# Patient Record
Sex: Male | Born: 1980
Health system: Southern US, Community
[De-identification: ages and names within clinical notes are randomized; demographics above are authoritative.]

## PROBLEM LIST (undated history)

## (undated) DIAGNOSIS — E559 Vitamin D deficiency, unspecified: Secondary | ICD-10-CM

## (undated) DIAGNOSIS — R17 Unspecified jaundice: Secondary | ICD-10-CM

## (undated) DIAGNOSIS — Z8619 Personal history of other infectious and parasitic diseases: Secondary | ICD-10-CM

## (undated) DIAGNOSIS — I7300 Raynaud's syndrome without gangrene: Secondary | ICD-10-CM

## (undated) HISTORY — PX: WISDOM TOOTH EXTRACTION: SHX21

## (undated) HISTORY — DX: Vitamin D deficiency, unspecified: E55.9

## (undated) HISTORY — PX: CYST EXCISION: SHX5701

## (undated) HISTORY — DX: Unspecified jaundice: R17

## (undated) HISTORY — DX: Personal history of other infectious and parasitic diseases: Z86.19

---

## 2018-09-01 ENCOUNTER — Ambulatory Visit (INDEPENDENT_AMBULATORY_CARE_PROVIDER_SITE_OTHER): Payer: 59 | Admitting: Family Medicine

## 2018-09-01 ENCOUNTER — Encounter: Payer: Self-pay | Admitting: Family Medicine

## 2018-09-01 ENCOUNTER — Other Ambulatory Visit: Payer: Self-pay

## 2018-09-01 VITALS — BP 100/70 | HR 78 | Temp 98.2°F | Ht 73.0 in | Wt 199.6 lb

## 2018-09-01 DIAGNOSIS — Z0000 Encounter for general adult medical examination without abnormal findings: Secondary | ICD-10-CM | POA: Diagnosis not present

## 2018-09-01 DIAGNOSIS — Z114 Encounter for screening for human immunodeficiency virus [HIV]: Secondary | ICD-10-CM

## 2018-09-01 DIAGNOSIS — E538 Deficiency of other specified B group vitamins: Secondary | ICD-10-CM

## 2018-09-01 DIAGNOSIS — E559 Vitamin D deficiency, unspecified: Secondary | ICD-10-CM | POA: Insufficient documentation

## 2018-09-01 DIAGNOSIS — R768 Other specified abnormal immunological findings in serum: Secondary | ICD-10-CM | POA: Insufficient documentation

## 2018-09-01 DIAGNOSIS — I7300 Raynaud's syndrome without gangrene: Secondary | ICD-10-CM | POA: Insufficient documentation

## 2018-09-01 NOTE — Progress Notes (Deleted)
Patient: Alexander Lynch MRN: [id number] DOB: 04-Jun-1980 PCP: No primary care provider on file.     I connected with Alexander Lynch on 09/01/18 at @CHLAPPTIME @ by a video enabled telemedicine application and verified that I am speaking with the correct person using two identifiers.  Location patient: Home Location provider: [facility], Office Persons participating in this virtual visit: ***  I discussed the limitations of evaluation and management by telemedicine and the availability of in person appointments. The patient expressed understanding and agreed to proceed.   Subjective:  Chief Complaint  Patient presents with  . Establish Care    HPI: The patient is a 38 y.o. male who presents today for ***  Review of Systems  Allergies Patient has no allergies on file.  Past Medical History Patient  has no past medical history on file.  Surgical History Patient  has no past surgical history on file.  Family History Pateint's family history is not on file.  Social History Patient      Objective: There were no vitals filed for this visit.  There is no height or weight on file to calculate BMI.  Physical Exam     Assessment/plan:      No follow-ups on file.  Records requested if needed. Time spent with patient: *** minutes, of which >50% was spent in obtaining information about his symptoms, reviweing his previous labs, evaluations, and treatments, counseling him about his conditions (please see discussed topics above), and developing a plan to further investigate it; he had a number of questions which I addressed.    Orma Flaming, MD [place]  09/01/2018

## 2018-09-01 NOTE — Progress Notes (Addendum)
Patient: Alexander Lynch MRN: [id number] DOB: [birth year] PCP: Orma Flaming, MD    I connected with  Vincent Peyer Haidar on 09/01/18 by a video enabled telemedicine application and verified that I am speaking with the correct person using two identifiers.   I discussed the limitations of evaluation and management by telemedicine. The patient expressed understanding and agreed to proceed.   Subjective:  Chief Complaint  Patient presents with  . Establish Care    HPI: The patient is a 38 y.o. male who presents today for annual exam. He denies any changes to past medical history. There have been no recent hospitalizations. They are following a well balanced diet, but does not exercise.  Weight has been stable. No complaints today.   He has a past medical hx consistent with raynauds and +ANA for RNP ab. Tested in 2017. No issues. +FH of Raynauds disease in mom and sister. He will use CBD oil only in very cold spells.   Hx of vitamin D deficiency from previous labs as well as border line low B12. He does take 2000IU/day of vitamin D and 1039mcg of B12 daily.   No first degree relative with colon cancer or prostate cancer. Maternal grandfather died from cancer but unknown what type. Hx of HTN, fatty liver, type 2 diabetes, obesity in his mother.   There is no immunization history on file for this patient.  Tdap: 2018 hiv screen: today  Flu: done this year   Review of Systems  Constitutional: Negative for chills, fatigue and fever.  HENT: Negative for dental problem, ear pain, hearing loss and trouble swallowing.   Eyes: Negative for visual disturbance.  Respiratory: Negative for cough, chest tightness and shortness of breath.   Cardiovascular: Negative for chest pain, palpitations and leg swelling.  Gastrointestinal: Negative for abdominal pain, blood in stool, diarrhea and nausea.  Endocrine: Negative for cold intolerance, polydipsia, polyphagia and polyuria.  Genitourinary: Negative for  dysuria and hematuria.  Musculoskeletal: Negative for arthralgias.  Skin: Negative for rash.  Neurological: Negative for dizziness and headaches.  Psychiatric/Behavioral: Negative for dysphoric mood and sleep disturbance. The patient is not nervous/anxious.     Allergies Patient has No Known Allergies.  Past Medical History Patient  has a past medical history of History of chicken pox.  Surgical History Patient  has no past surgical history on file.  Family History Pateint's family history includes Cancer in his maternal aunt, maternal grandfather, and maternal grandmother; Dementia in his maternal grandmother; Diabetes in his mother; Hearing loss in his father and mother; Hypertension in his mother; Obesity in his mother.  Social History Patient  reports that he has never smoked. He has never used smokeless tobacco. He reports current alcohol use. He reports that he does not use drugs.    Objective: Vitals:   09/01/18 0841  BP: 100/70  Pulse: 78  Temp: 98.2 F (36.8 C)  TempSrc: Oral  SpO2: 98%  Weight: 199 lb 9.6 oz (90.5 kg)  Height: 6\' 1"  (1.854 m)    Body mass index is 26.33 kg/m.  Physical Exam Vitals signs reviewed.  Constitutional:      Appearance: Normal appearance.  HENT:     Head: Normocephalic and atraumatic.  Pulmonary:     Effort: Pulmonary effort is normal.  Neurological:     General: No focal deficit present.     Mental Status: He is alert and oriented to person, place, and time.  Psychiatric:        Mood  and Affect: Mood normal.        Behavior: Behavior normal.        Assessment/plan: 1. Annual physical exam utd on HM. Requesting records for tdap, but other records received and reviewed before appointment. Recommended exercise into lifestyle, otherwise doing great. Future ordered labs and he can come in at his own time.  Patient counseling [x]    Nutrition: Stressed importance of moderation in sodium/caffeine intake, saturated fat and  cholesterol, caloric balance, sufficient intake of fresh fruits, vegetables, fiber, calcium, iron, and 1 mg of folate supplement per day (for females capable of pregnancy).  [x]    Stressed the importance of regular exercise.   []    Substance Abuse: Discussed cessation/primary prevention of tobacco, alcohol, or other drug use; driving or other dangerous activities under the influence; availability of treatment for abuse.   [x]    Injury prevention: Discussed safety belts, safety helmets, smoke detector, smoking near bedding or upholstery.   [x]    Sexuality: Discussed sexually transmitted diseases, partner selection, use of condoms, avoidance of unintended pregnancy  and contraceptive alternatives.  [x]    Dental health: Discussed importance of regular tooth brushing, flossing, and dental visits.  [x]    Health maintenance and immunizations reviewed. Please refer to Health maintenance section.   - CBC with Differential/Platelet; Future - Comprehensive metabolic panel; Future - Lipid panel; Future - TSH; Future  2. Vitamin D deficiency  - VITAMIN D 25 Hydroxy (Vit-D Deficiency, Fractures); Future  3. Encounter for screening for HIV  - HIV Antibody (routine testing w rflx); Future  4. B12 deficiency  - Vitamin B12; Future   Return in about 1 year (around 09/01/2019).   Orma Flaming, MD [place]  09/01/2018

## 2018-10-05 ENCOUNTER — Other Ambulatory Visit: Payer: Self-pay

## 2018-10-05 ENCOUNTER — Other Ambulatory Visit (INDEPENDENT_AMBULATORY_CARE_PROVIDER_SITE_OTHER): Payer: 59

## 2018-10-05 DIAGNOSIS — E538 Deficiency of other specified B group vitamins: Secondary | ICD-10-CM | POA: Diagnosis not present

## 2018-10-05 DIAGNOSIS — Z0000 Encounter for general adult medical examination without abnormal findings: Secondary | ICD-10-CM | POA: Diagnosis not present

## 2018-10-05 DIAGNOSIS — E559 Vitamin D deficiency, unspecified: Secondary | ICD-10-CM | POA: Diagnosis not present

## 2018-10-05 DIAGNOSIS — Z114 Encounter for screening for human immunodeficiency virus [HIV]: Secondary | ICD-10-CM | POA: Diagnosis not present

## 2018-10-05 LAB — LIPID PANEL
Cholesterol: 161 mg/dL (ref 0–200)
HDL: 34 mg/dL — ABNORMAL LOW (ref >39.00)
NonHDL: 126.67
Total CHOL/HDL Ratio: 5
Triglycerides: 250 mg/dL — ABNORMAL HIGH (ref 0.0–149.0)
VLDL: 50 mg/dL — ABNORMAL HIGH (ref 0.0–40.0)

## 2018-10-05 LAB — COMPREHENSIVE METABOLIC PANEL
ALT: 31 U/L (ref 0–53)
AST: 18 U/L (ref 0–37)
Albumin: 4.5 g/dL (ref 3.5–5.2)
Alkaline Phosphatase: 54 U/L (ref 39–117)
BUN: 15 mg/dL (ref 6–23)
CO2: 32 mEq/L (ref 19–32)
Calcium: 9.3 mg/dL (ref 8.4–10.5)
Chloride: 100 mEq/L (ref 96–112)
Creatinine, Ser: 0.8 mg/dL (ref 0.40–1.50)
GFR: 106.93 mL/min (ref >60.00)
Glucose, Bld: 84 mg/dL (ref 70–99)
Potassium: 4.7 mEq/L (ref 3.5–5.1)
Sodium: 139 mEq/L (ref 135–145)
Total Bilirubin: 0.5 mg/dL (ref 0.2–1.2)
Total Protein: 7.4 g/dL (ref 6.0–8.3)

## 2018-10-05 LAB — TSH: TSH: 1.66 u[IU]/mL (ref 0.35–4.50)

## 2018-10-05 LAB — CBC WITH DIFFERENTIAL/PLATELET
Basophils Absolute: 0 10*3/uL (ref 0.0–0.1)
Basophils Relative: 0.6 % (ref 0.0–3.0)
Eosinophils Absolute: 0.2 10*3/uL (ref 0.0–0.7)
Eosinophils Relative: 4.6 % (ref 0.0–5.0)
HCT: 43.6 % (ref 39.0–52.0)
Hemoglobin: 15 g/dL (ref 13.0–17.0)
Lymphocytes Relative: 44.6 % (ref 12.0–46.0)
Lymphs Abs: 2.1 10*3/uL (ref 0.7–4.0)
MCHC: 34.4 g/dL (ref 30.0–36.0)
MCV: 89.3 fl (ref 78.0–100.0)
Monocytes Absolute: 0.4 10*3/uL (ref 0.1–1.0)
Monocytes Relative: 8.2 % (ref 3.0–12.0)
Neutro Abs: 1.9 10*3/uL (ref 1.4–7.7)
Neutrophils Relative %: 42 % — ABNORMAL LOW (ref 43.0–77.0)
Platelets: 139 10*3/uL — ABNORMAL LOW (ref 150.0–400.0)
RBC: 4.88 Mil/uL (ref 4.22–5.81)
RDW: 12.8 % (ref 11.5–15.5)
WBC: 4.6 10*3/uL (ref 4.0–10.5)

## 2018-10-05 LAB — VITAMIN B12: Vitamin B-12: 160 pg/mL — ABNORMAL LOW (ref 211–911)

## 2018-10-05 LAB — VITAMIN D 25 HYDROXY (VIT D DEFICIENCY, FRACTURES): VITD: 20.81 ng/mL — ABNORMAL LOW (ref 30.00–100.00)

## 2018-10-05 LAB — LDL CHOLESTEROL, DIRECT: Direct LDL: 90 mg/dL

## 2018-10-06 LAB — HIV ANTIBODY (ROUTINE TESTING W REFLEX): HIV 1&2 Ab, 4th Generation: NONREACTIVE

## 2018-10-07 ENCOUNTER — Other Ambulatory Visit: Payer: Self-pay | Admitting: Family Medicine

## 2018-10-07 MED ORDER — VITAMIN D (ERGOCALCIFEROL) 1.25 MG (50000 UNIT) PO CAPS: ORAL_CAPSULE | ORAL | 0 refills | Status: DC

## 2018-10-29 ENCOUNTER — Other Ambulatory Visit: Payer: Self-pay | Admitting: Family Medicine

## 2018-10-29 MED ORDER — VITAMIN D (ERGOCALCIFEROL) 1.25 MG (50000 UNIT) PO CAPS: ORAL_CAPSULE | ORAL | 0 refills | Status: DC

## 2018-10-29 MED FILL — VIT D2 1.25 MG (50,000 UNIT: 1.25 MG | 56 days supply | Qty: 8 | Fill #0

## 2019-07-18 ENCOUNTER — Other Ambulatory Visit: Payer: Self-pay

## 2019-07-18 ENCOUNTER — Ambulatory Visit
Admission: EM | Admit: 2019-07-18 | Discharge: 2019-07-18 | Disposition: A | Discharge status: Home / Self Care | Payer: No Typology Code available for payment source | Attending: Emergency Medicine | Admitting: Emergency Medicine

## 2019-07-18 ENCOUNTER — Encounter: Payer: Self-pay | Admitting: Emergency Medicine

## 2019-07-18 ENCOUNTER — Ambulatory Visit (INDEPENDENT_AMBULATORY_CARE_PROVIDER_SITE_OTHER): Payer: No Typology Code available for payment source

## 2019-07-18 DIAGNOSIS — M545 Low back pain, unspecified: Secondary | ICD-10-CM

## 2019-07-18 HISTORY — DX: Raynaud's syndrome without gangrene: I73.00

## 2019-07-18 MED ORDER — KETOROLAC TROMETHAMINE 30 MG/ML IJ SOLN
30.0000 mg | Freq: Once | INTRAMUSCULAR | Status: AC
  Administered 2019-07-18: 30 mg via INTRAMUSCULAR

## 2019-07-18 NOTE — ED Notes (Signed)
Patient able to ambulate independently  

## 2019-07-18 NOTE — Discharge Instructions (Addendum)
Recommend RICE: rest, ice, compression, elevation as needed for pain.   Heat therapy (hot compress, warm wash red, hot showers, etc.) can help relax muscles and soothe muscle aches. Cold therapy (ice packs) can be used to help swelling both after injury and after prolonged use of areas of chronic pain/aches.  For pain: recommend 350 mg-1000 mg of Tylenol (acetaminophen) and/or 200 mg - 800 mg of Advil (ibuprofen, Motrin) every 8 hours as needed.  May alternate between the two throughout the day as they are generally safe to take together.  DO NOT exceed more than 3000 mg of Tylenol or 3200 mg of ibuprofen in a 24 hour period as this could damage your stomach, kidneys, liver, or increase your bleeding risk.  Please keep your follow-up appointment with Ortho tomorrow at 230-please go to the ER in the interim if you develop numbness in your bathing suit area or legs, weakness, difficulty holding your bowel or bladder, fever.

## 2019-07-18 NOTE — ED Triage Notes (Signed)
Pt presents to [facility] for assessment of lower back pain after taking his first few steps for an exercise run and began to have right lower back pain.

## 2019-07-18 NOTE — ED Provider Notes (Signed)
[facility]    CSN: [id number] Arrival date & time: 07/18/19  1249      History   Chief Complaint Chief Complaint  Patient presents with  . Back Pain    HPI Alexander Lynch is a 39 y.o. male  presenting for acute onset of low back pain (R>L) since earlier this morning.  Patient states that he has a history of a "bulging disc in his low back" for which he typically does exercises for.  States that he went to go for a run this morning, and a few steps and when pushing off with his left foot he felt a sharp pain in the right side of his back caused him to fall.  Patient states that his wife had to help him get home, and he tried his exercises without relief.  Patient denies saddle area anesthesia, change in bowel or bladder habit, lower extremity weakness/numbness.   Past Medical History:  Diagnosis Date  . History of chicken pox   . Raynaud's disease     Patient Active Problem List   Diagnosis Date Noted  . Raynauds phenomenon 09/01/2018  . ANA positive 09/01/2018  . Vitamin D deficiency 09/01/2018    History reviewed. No pertinent surgical history.     Home Medications    Prior to Admission medications   Medication Sig Start Date End Date Taking? Authorizing Provider  Vitamin D, Ergocalciferol, (DRISDOL) 1.25 MG (50000 UT) CAPS capsule One capsule by mouth once a week for 8 weeks. Then take 1000IU/day 10/29/18   Orma Flaming, MD    Family History Family History  Problem Relation Age of Onset  . Diabetes Mother   . Hypertension Mother   . Hearing loss Mother   . Obesity Mother   . Hearing loss Father   . Cancer Maternal Aunt   . Cancer Maternal Grandmother   . Dementia Maternal Grandmother   . Cancer Maternal Grandfather     Social History Social History   Tobacco Use  . Smoking status: Never Smoker  . Smokeless tobacco: Never Used  Substance Use Topics  . Alcohol use: Yes    Comment: rarely  . Drug use: Never     Allergies   Patient  has no known allergies.   Review of Systems As per HPI   Physical Exam Triage Vital Signs ED Triage Vitals [07/18/19 1300]  Enc Vitals Group     BP (!) 141/97     Pulse Rate 91     Resp 18     Temp 97.6 F (36.4 C)     Temp Source Temporal     SpO2 98 %     Weight      Height      Head Circumference      Peak Flow      Pain Score 6     Pain Loc      Pain Edu?      Excl. in [place]?    No data found.  Updated Vital Signs BP (!) 141/97 (BP Location: Right Arm)   Pulse 91   Temp 97.6 F (36.4 C) (Temporal)   Resp 18   SpO2 98%   Visual Acuity Right Eye Distance:   Left Eye Distance:   Bilateral Distance:    Right Eye Near:   Left Eye Near:    Bilateral Near:     Physical Exam Constitutional:      General: He is not in acute distress. HENT:  Head: Normocephalic and atraumatic.  Eyes:     General: No scleral icterus.    Pupils: Pupils are equal, round, and reactive to light.  Cardiovascular:     Rate and Rhythm: Normal rate.  Pulmonary:     Effort: Pulmonary effort is normal. No respiratory distress.     Breath sounds: No wheezing.  Musculoskeletal:     Comments: Patient leaning to left side.  Tender over mid and right lumbar spine sparing PSIS.  No obvious bony deformity, scoliosis.  Overall exam limited second to patient's cooperation due to pain  Skin:    Coloration: Skin is not jaundiced or pale.  Neurological:     Mental Status: He is alert and oriented to person, place, and time.     Gait: Gait abnormal.      UC Treatments / Results  Labs (all labs ordered are listed, but only abnormal results are displayed) Labs Reviewed - No data to display  EKG   Radiology DG Lumbar Spine Complete  Result Date: 07/18/2019 CLINICAL DATA:  Pain, injury, history of disc bulge EXAM: LUMBAR SPINE - COMPLETE 4+ VIEW COMPARISON:  None. FINDINGS: No fracture or dislocation of the lumbar spine. There is minimal multilevel disc space height loss and  osteophytosis throughout. Mild facet degenerative change of the lower lumbar levels. Nonobstructive pattern of overlying bowel gas. IMPRESSION: No fracture or dislocation of the lumbar spine. Minimal multilevel disc space height loss and osteophytosis throughout. Mild facet degenerative change of the lower lumbar levels. Lumbar disc and neural foraminal pathology may be further evaluated by MRI if indicated by localizing signs and symptoms. Electronically Signed   By: Eddie Candle M.D.   On: 07/18/2019 13:38    Procedures Procedures (including critical care time)  Medications Ordered in UC Medications  ketorolac (TORADOL) 30 MG/ML injection 30 mg (30 mg Intramuscular Given 07/18/19 1416)    Initial Impression / Assessment and Plan / UC Course  I have reviewed the triage vital signs and the nursing notes.  Pertinent labs & imaging results that were available during my care of the patient were reviewed by me and considered in my medical decision making (see chart for details).     Patient without alarm symptoms such as saddle anesthesia, lower extremity weakness, numbness, change in bowel or bladder habit, fever.  Lumbar x-ray obtained in office, reviewed by me and radiology without previous to compare: No fracture or dislocation of lumbar spine.  There are minimal multilevel disc space height loss and osteophytosis.  There is mild facet degenerative change of lower lumbar levels.  Could evaluate further via MRI.  Very findings with patient verbalized understanding.  This patient coordinated care: Has Ortho appointment tomorrow 230-relayed info to patient verbalized understanding intends to keep this.  Given Toradol shot for pain and inflammation, will try additional supportive management as outlined below at home.  Strict ER return precautions for discussed, patient verbalized understanding and is agreeable to plan. Final Clinical Impressions(s) / UC Diagnoses   Final diagnoses:  Acute  right-sided low back pain without sciatica     Discharge Instructions     Recommend RICE: rest, ice, compression, elevation as needed for pain.   Heat therapy (hot compress, warm wash red, hot showers, etc.) can help relax muscles and soothe muscle aches. Cold therapy (ice packs) can be used to help swelling both after injury and after prolonged use of areas of chronic pain/aches.  For pain: recommend 350 mg-1000 mg of Tylenol (acetaminophen) and/or 200  mg - 800 mg of Advil (ibuprofen, Motrin) every 8 hours as needed.  May alternate between the two throughout the day as they are generally safe to take together.  DO NOT exceed more than 3000 mg of Tylenol or 3200 mg of ibuprofen in a 24 hour period as this could damage your stomach, kidneys, liver, or increase your bleeding risk.  Please keep your follow-up appointment with Ortho tomorrow at 230-please go to the ER in the interim if you develop numbness in your bathing suit area or legs, weakness, difficulty holding your bowel or bladder, fever.    ED Prescriptions    None     PDMP not reviewed this encounter.   [place], [place], [place] 07/18/19 1435

## 2021-07-31 ENCOUNTER — Telehealth: Payer: Self-pay | Admitting: Gastroenterology

## 2021-07-31 IMAGING — DX DG LUMBAR SPINE COMPLETE 4+V
5 series · 5 of 5 positions shown · non-contrast
Comparison: None.

CLINICAL DATA: Pain, injury, history of disc bulge

EXAM:
LUMBAR SPINE - COMPLETE 4+ VIEW

[lumbar spine ap (1 of 3)]
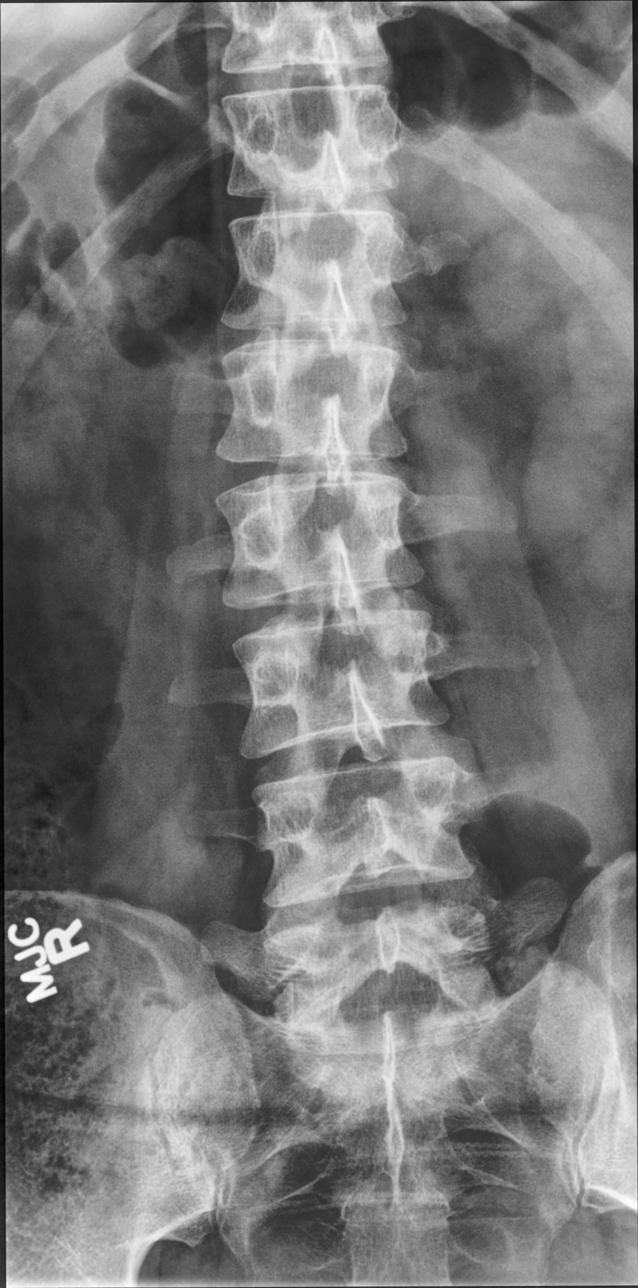

[lumbar spine ap (2 of 3)]
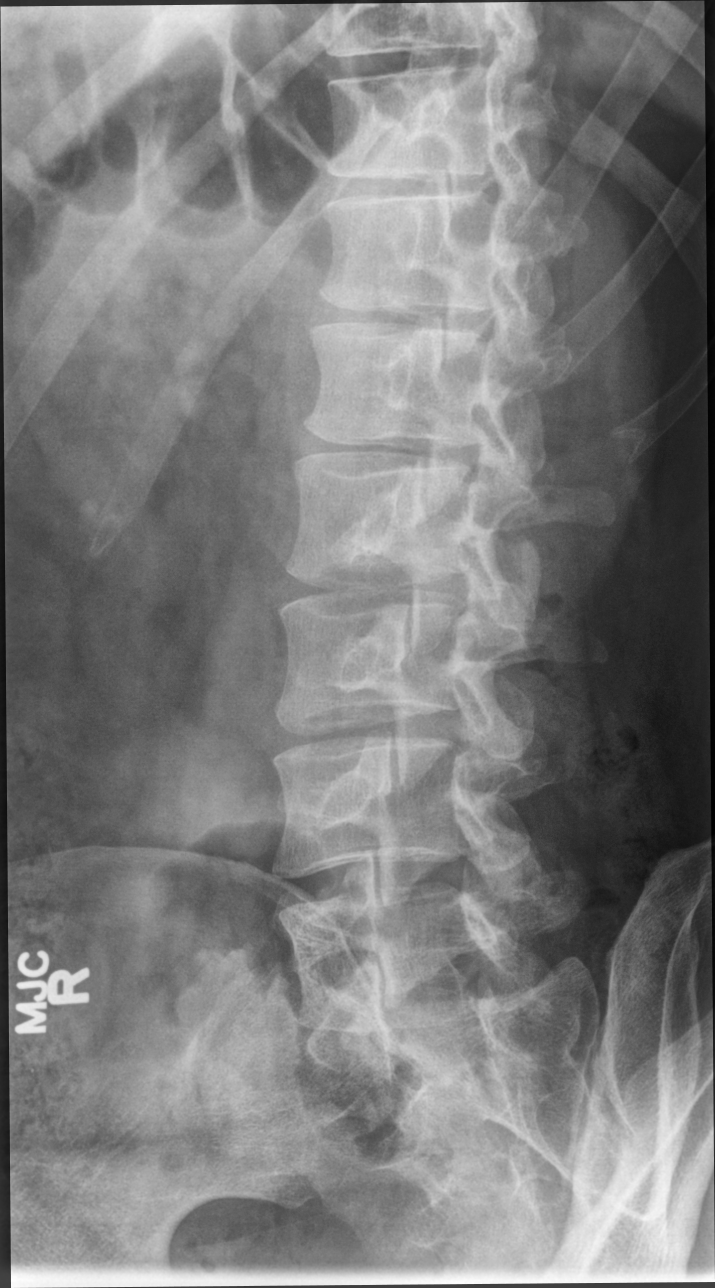

[lumbar spine ap (3 of 3)]
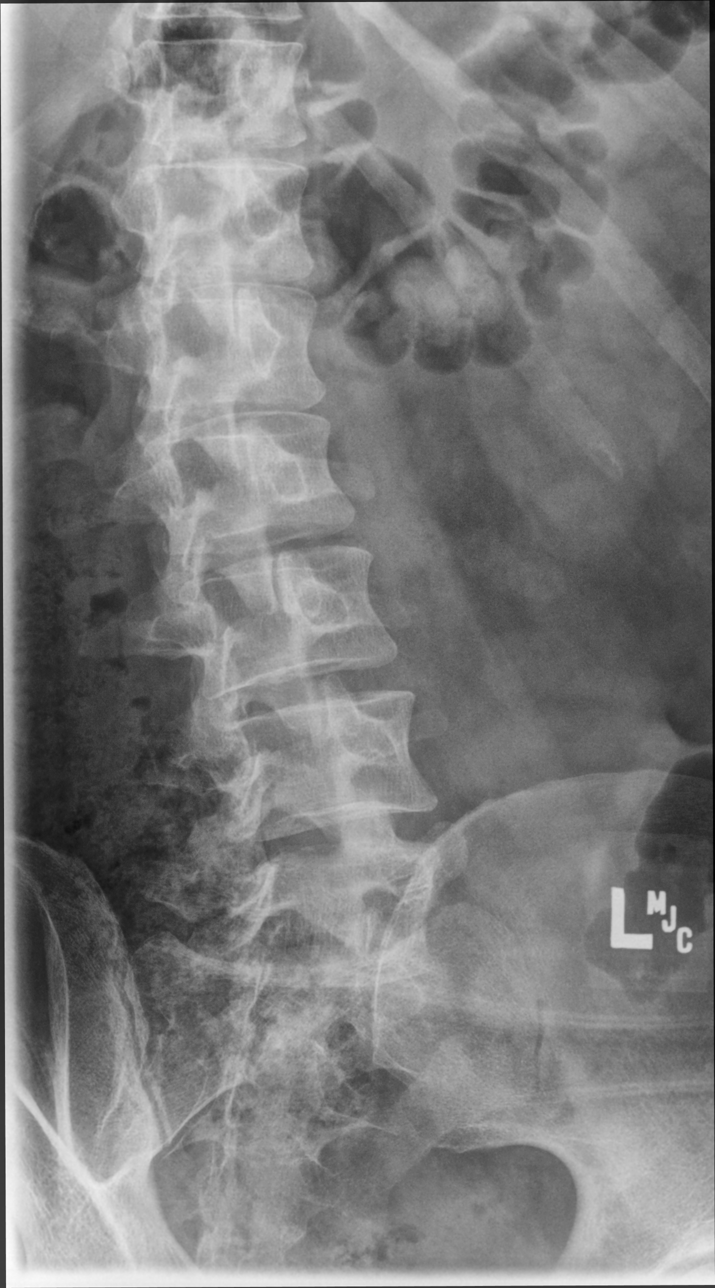

[lumbar spine lat (1 of 2)]
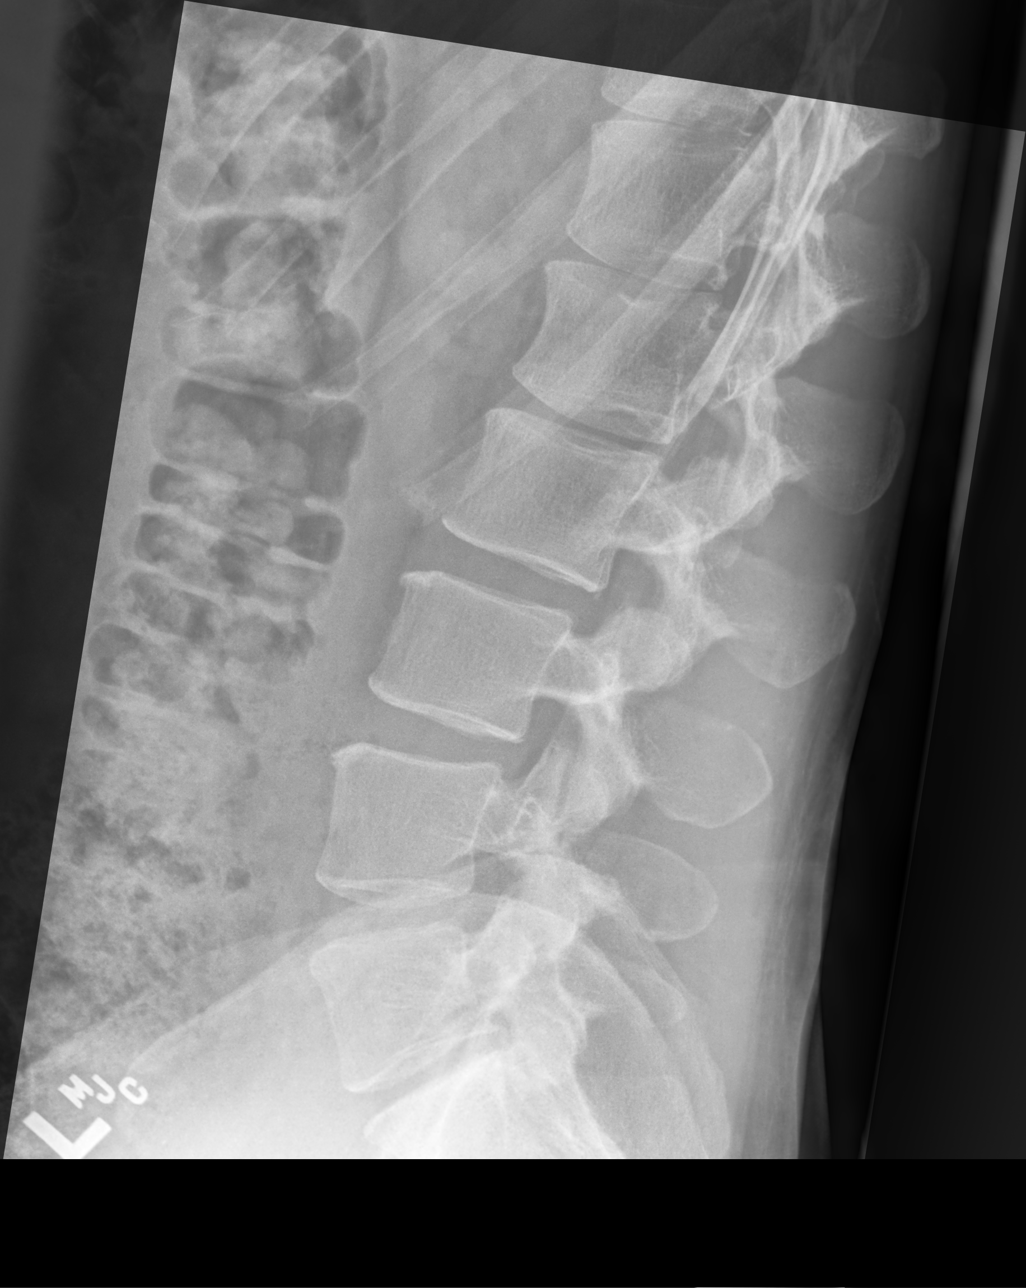

[lumbar spine lat (2 of 2)]
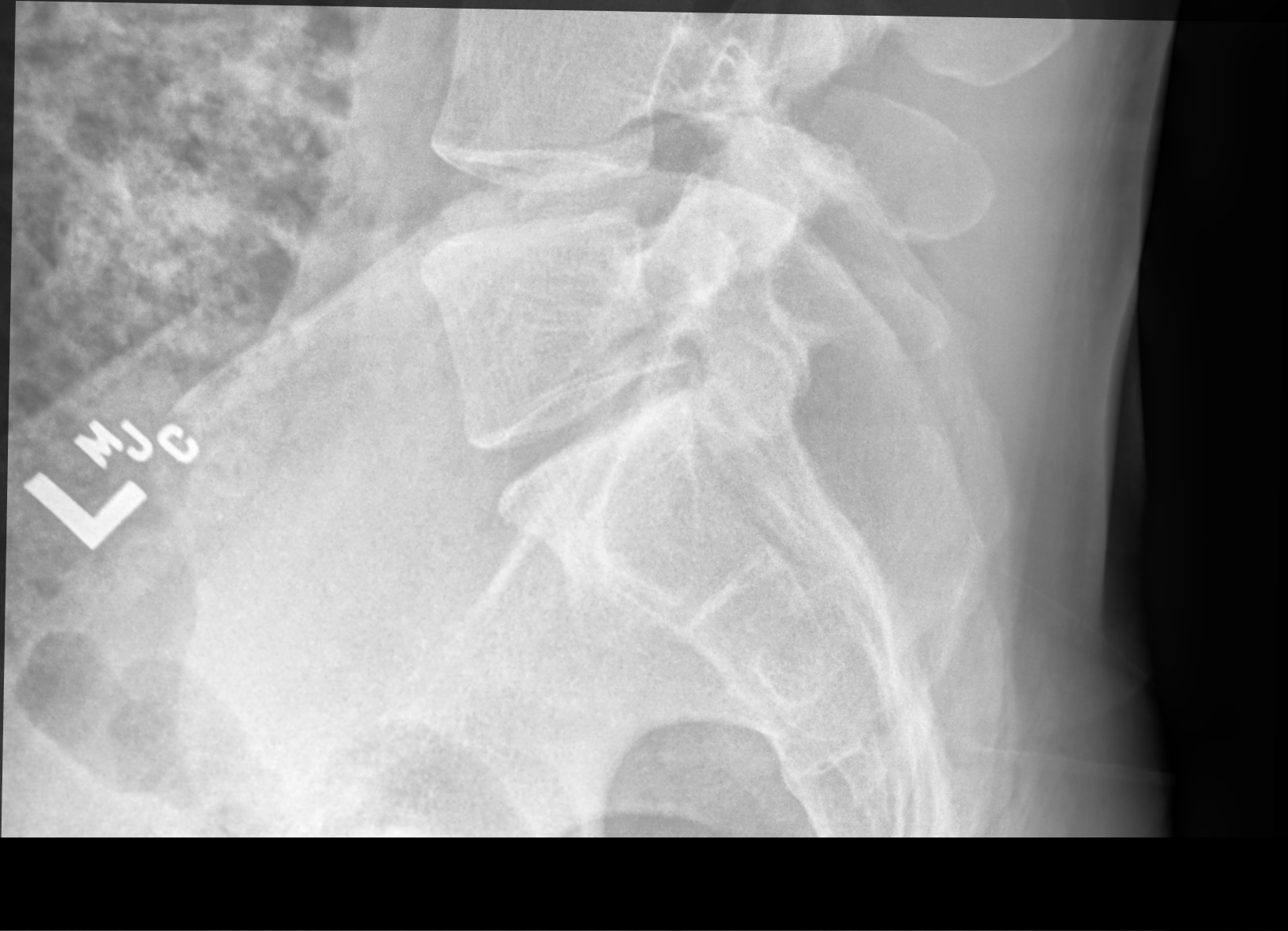

[5 of 5 positions shown; findings below may reference images not displayed]

FINDINGS: No fracture or dislocation of the lumbar spine. There is minimal
multilevel disc space height loss and osteophytosis throughout. Mild
facet degenerative change of the lower lumbar levels. Nonobstructive
pattern of overlying bowel gas.
IMPRESSION: No fracture or dislocation of the lumbar spine. Minimal multilevel
disc space height loss and osteophytosis throughout. Mild facet
degenerative change of the lower lumbar levels. Lumbar disc and
neural foraminal pathology may be further evaluated by MRI if
indicated by localizing signs and symptoms.

## 2021-08-01 ENCOUNTER — Encounter: Payer: Self-pay | Admitting: Gastroenterology

## 2021-08-01 NOTE — Telephone Encounter (Signed)
error 

## 2021-08-20 ENCOUNTER — Encounter: Payer: Self-pay | Admitting: Gastroenterology

## 2021-08-20 ENCOUNTER — Ambulatory Visit: Payer: 59 | Admitting: Gastroenterology

## 2021-08-20 ENCOUNTER — Other Ambulatory Visit (INDEPENDENT_AMBULATORY_CARE_PROVIDER_SITE_OTHER): Payer: 59

## 2021-08-20 VITALS — BP 112/74 | HR 74 | Ht 73.0 in | Wt 201.0 lb

## 2021-08-20 DIAGNOSIS — R109 Unspecified abdominal pain: Secondary | ICD-10-CM

## 2021-08-20 DIAGNOSIS — R17 Unspecified jaundice: Secondary | ICD-10-CM

## 2021-08-20 DIAGNOSIS — R197 Diarrhea, unspecified: Secondary | ICD-10-CM

## 2021-08-20 LAB — TSH: TSH: 1.38 u[IU]/mL (ref 0.35–5.50)

## 2021-08-20 LAB — SEDIMENTATION RATE: Sed Rate: 13 mm/hr (ref 0–15)

## 2021-08-20 LAB — C-REACTIVE PROTEIN: CRP: <1 mg/dL (ref 0.5–20.0)

## 2021-08-20 MED ORDER — DICYCLOMINE HCL 20 MG PO TABS: 20.0000 mg | ORAL_TABLET | Freq: Three times a day (TID) | ORAL | 3 refills | Status: DC

## 2021-08-20 MED ORDER — NA SULFATE-K SULFATE-MG SULF 17.5-3.13-1.6 GM/177ML PO SOLN: 1.0000 | Freq: Once | ORAL | 0 refills | Status: AC

## 2021-08-20 NOTE — Progress Notes (Addendum)
? ?Referring Provider: No ref. provider found ?Primary Care Physician:  Pcp, No ? ? ?Reason for Consultation:  Diarrhea, abdominal pain ? ? ?IMPRESSION:  ?Diarrhea ?Abdominal pain ?Acute ileitis on colonoscopy 2013 - biopsies without classic features of IBD ?Raynaud's disease ? ? ?Postprandial abdominal pain and diarrhea without alarm features may be cause by a rapid colonic response to feeding, food allergy or hypersensitivity including celiac, food intolerance in his case at least lactulose but potentially others, altered gut flora or small bowel bacterial overgrowth, fructose maldigestion, malabsorption including bile acid malabsorption, and functional GI causes. However, given his colonoscopy in 2013 showing acute ileitis and he history of Raynaud's, IBD must be excluded. Thankfully, food elimination controls his symptoms most of the time. Some of his symptoms are consistent with lactose intolerance but other concurrent food intolerances must also be considered. ? ? ?PLAN: ?- Fecal calprotectin, ESR, and CRP to screen for IBD ?- Giardia testing ?- TSH ?- IgA tissue transglutaminase, IgA level to test for celiac disease ?- Avoid carbonated beverages, artificial sweeteners, and dairy ?- Dicyclomine 20 mg QID prn prior to meals and large snacks was offered for symptom management ?- Colonoscopy with random biopsies and evaluation of the terminal ileum ?- Referral to food allergist if colonoscopy if endoscopic evaluation is negative ?- Consider trial of Xifaxan if not responding to tdicyclomine and evaluation above is negative ? ?I consented the patient at the bedside today discussing the risks, benefits, and alternatives to endoscopic evaluation. In particular, we discussed the risks that include, but are not limited to, reaction to medication, cardiopulmonary compromise, bleeding requiring blood transfusion, aspiration resulting in pneumonia, perforation requiring surgery, and even death. We reviewed the risk of  missed lesion including polyps or even cancer. The patient acknowledges these risks and asks that we proceed.  ? ? ? ?HPI: Alexander Lynch is a 41 y.o. male self-referred for diarrhea and abdominal pain.  The history is obtained through the patient.  He supplied a copy of his last colonoscopy.  Otherwise, I have no access to prior medical records at the time of his consultation this morning. He does not have a primary care provider and has never seen a gastroenterologist for these symptoms. 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 He works remotely from [employer]. His wife is a CMA.  ? ?Colonoscopy from 02/20/2012 at the [facility] in [facility] with Dr. Einar Crow to evaluate hematochezia, melena, and weight loss showed a normal colonoscopy to the terminal ileum except for small internal hemorrhoids.  Small bowel biopsies showed minimal acute and active inflammation.  No granulomas were seen.  Pathologist noticed that this could be due to IBD, infectious causes, medications, ischemia, and even bowel prep.  Colon biopsies were normal. ? ?He has a history of a "cold stomach."  His stomach is cold and cramping. Although sometimes what he eats makes it feel feel hot. He is acutely aware of food traveling through his digestive tract. ? ?He reports a lifetime history of postprandial abdominal pain and diarrhea. However, over the last 2 years he has been carefully tracking his eating habits to identify triggers.  He identifies, Milk, A25mlk, iceburg lettuce, graham crackers, oats, eggs except if they are very fresh, cheese unless they are baked. Dairy intermittently causes GI symptoms.   He feels like the pain is most severe when it gets to his colon.  ? ?He reports food allergies that cause him to have diarrhea.  More recently, he feels that he is having difficulty with additional foods. No GI symptoms if he does not eat these foods.  However, he would like to eat these foods again.   ? ?No blood or mucous in the stool. Weight is stable. Appetite is good. Abdominal pain improves with defecation.  ? ?He denies the use of NSAIDs except for rare ibuprofen around headaches.  He has not tried any OTC meds to control his symptoms.  ? ?He is wondering if his gut bacteria has changed over time and if this is resulting in the change in food intolerance.  ? ?He was having the food problems in 2013 at the time of his colonoscopy but did not mention them to his gastroenterologist.  ? ?His mother also has a history of a "cold stomach" with similar symptoms but has had difficulty managing the symptoms.  There is no known family history of colon cancer or polyps. No family history of stomach cancer or other GI malignancy. No family history of inflammatory bowel disease or celiac.  ? ? ?Past Medical History:  ?Diagnosis Date  ? History of chicken pox   ? Jaundice   ? Raynaud's disease   ? Vitamin D deficiency   ? ? ?Past Surgical History:  ?Procedure Laterality Date  ? CYST EXCISION    ? Neck  ? WISDOM TOOTH EXTRACTION    ? ? ? ?Current Outpatient Medications  ?Medication Sig Dispense Refill  ? Cholecalciferol (VITAMIN D) 50 MCG (2000 UT) CAPS Take 1 capsule by mouth daily.    ? ?No current facility-administered medications for this visit.  ? ? ?Allergies as of 08/20/2021  ? (No Known Allergies)  ? ? ?Family History  ?Problem Relation Age of Onset  ? Diabetes Mother   ? Hypertension Mother   ? Hearing loss Mother   ? Obesity Mother   ? Breast cancer Mother   ? Hearing loss Father   ? Cancer Maternal Grandmother   ?     type unknown  ? Dementia Maternal Grandmother   ? Cancer Maternal Grandfather   ?     type unknown  ? Diabetes Maternal Grandfather   ? Breast cancer Maternal Aunt   ? Colon cancer Neg Hx   ? Esophageal cancer Neg Hx   ? Stomach cancer Neg Hx   ? Colon polyps Neg Hx   ? ? ?Social History  ? ?Socioeconomic History  ? Marital status: Married  ?  Spouse name: Anderson Malta  ? Number of children: 1  ?  Years of education: Not on file  ? Highest education level: Not on file  ?Occupational History  ? Not on file  ?Tobacco Use  ? Smoking status: Never  ? Smokeless tobacco: Never  ?Vaping Use  ? Vaping Use: Never used  ?Substance and Sexual Activity  ? Alcohol use: Not Currently  ?  Comment: rarely  ? Drug use: Never  ? Sexual activity: Yes  ?  Birth control/protection: None  ?Other Topics Concern  ? Not on file  ?Social History Narrative  ? Not on file  ? ?Social Determinants of Health  ? ?Financial Resource Strain: Not on file  ?Food Insecurity: Not on file  ?Transportation Needs: Not on file  ?Physical Activity: Not on file  ?Stress: Not on file  ?Social Connections: Not on file  ?Intimate Partner Violence: Not on file  ? ? ?Review of Systems: ?12 system ROS is negative except as noted above except for back pain related  to a bulging disk.  ? ?Physical Exam: ?General:   Alert,  well-nourished, pleasant and cooperative in NAD ?Head:  Normocephalic and atraumatic. ?Eyes:  Sclera clear, no icterus.   Conjunctiva pink. ?Ears:  Normal auditory acuity. ?Nose:  No deformity, discharge,  or lesions. ?Mouth:  No deformity or lesions.   ?Neck:  Supple; no masses or thyromegaly. ?Lungs:  Clear throughout to auscultation.   No wheezes. ?Heart:  Regular rate and rhythm; no murmurs. ?Abdomen:  Soft, nontender, nondistended, normal bowel sounds, no rebound or guarding. No hepatosplenomegaly.   ?Rectal:  Deferred  ?Msk:  Symmetrical. No boney deformities ?LAD: No inguinal or umbilical LAD ?Extremities:  No clubbing or edema. ?Neurologic:  Alert and  oriented x4;  grossly nonfocal ?Skin:  Intact without significant lesions or rashes. ?Psych:  Alert and cooperative. Normal mood and affect. ? ? ?Jevaun Strick L. Tarri Glenn, MD, MPH ?08/20/2021, 8:27 AM ? ? ? ?  ?

## 2021-08-20 NOTE — Patient Instructions (Addendum)
It was my pleasure to provide care to you today. Based on our discussion, I am providing you with my recommendations below: ? ?RECOMMENDATION(S):  ? ?I have recommended labs and stool studies to evaluate for causes of abdominal pain and diarrhea that occurs after eating.   ? ?I also recommended a colonoscopy with biopsies of the terminal ileum and colon.  ? ?We discussed a trial of dicyclomine 20 mg taken prior to meals and large snacks as a way to potentially control your symptoms.  This prescription will be sent to your pharmacy in the event you would like to try it. ? ?I am happy to refer you to an allergist at any time if you would like to pursue formal food allergy testing. ? ?In the meantime, please continue to avoid foods that trigger your symptoms.  In addition carbonated beverages and artificial sweeteners may be contributing to your symptoms. ? ?LABS:  ? ?Please proceed to the basement level for lab work before leaving today. Press "B" on the elevator. The lab is located at the first door on the left as you exit the elevator. ? ?PRESCRIPTION MEDICATION(S):  ? ?We have sent the following medication(s) to your pharmacy: ? ?Dicyclomine 20 mg four times daily as needed prior to meal and large snacks. ? ? ?FOLLOW UP: ? ?After your procedure, you will receive a call from my office staff regarding my recommendation for follow up. ? ?BMI: ? ?If you are age 71 or younger, your body mass index should be between 19-25. Your Body mass index is 26.52 kg/m?Marland Kitchen If this is out of the aformentioned range listed, please consider follow up with your Primary Care Provider.  ? ?MY CHART: ? ?The [facility] providers would like to encourage you to use [facility] to communicate with providers for non-urgent requests or questions.  Due to long hold times on the telephone, sending your provider a message by [facility] may be a faster and more efficient way to get a response.  Please allow 48 business hours for a response.  Please remember  that this is for non-urgent requests.  ? ?Thank you for trusting me with your gastrointestinal care!   ? ?Thornton Park, MD, MPH ? ?

## 2021-08-21 LAB — TISSUE TRANSGLUTAMINASE ABS,IGG,IGA
(tTG) Ab, IgA: <1 U/mL
(tTG) Ab, IgG: <1 U/mL

## 2021-08-21 LAB — IGA: Immunoglobulin A: 439 mg/dL — ABNORMAL HIGH (ref 47–310)

## 2021-08-23 ENCOUNTER — Encounter: Payer: Self-pay | Admitting: Gastroenterology

## 2021-08-26 ENCOUNTER — Other Ambulatory Visit: Payer: 59

## 2021-08-26 DIAGNOSIS — R109 Unspecified abdominal pain: Secondary | ICD-10-CM

## 2021-08-26 DIAGNOSIS — R197 Diarrhea, unspecified: Secondary | ICD-10-CM | POA: Diagnosis not present

## 2021-08-26 DIAGNOSIS — R17 Unspecified jaundice: Secondary | ICD-10-CM | POA: Diagnosis not present

## 2021-08-27 LAB — GIARDIA ANTIGEN
MICRO NUMBER:: 13213880
RESULT:: NOT DETECTED
SPECIMEN QUALITY:: ADEQUATE

## 2021-08-29 ENCOUNTER — Ambulatory Visit (AMBULATORY_SURGERY_CENTER): Payer: 59 | Admitting: Gastroenterology

## 2021-08-29 ENCOUNTER — Encounter: Payer: Self-pay | Admitting: Gastroenterology

## 2021-08-29 ENCOUNTER — Telehealth: Payer: Self-pay

## 2021-08-29 VITALS — BP 116/80 | HR 72 | Temp 98.7°F | Resp 14 | Ht 73.0 in | Wt 201.0 lb

## 2021-08-29 DIAGNOSIS — R109 Unspecified abdominal pain: Secondary | ICD-10-CM | POA: Diagnosis not present

## 2021-08-29 DIAGNOSIS — D123 Benign neoplasm of transverse colon: Secondary | ICD-10-CM | POA: Diagnosis not present

## 2021-08-29 DIAGNOSIS — R197 Diarrhea, unspecified: Secondary | ICD-10-CM | POA: Diagnosis not present

## 2021-08-29 DIAGNOSIS — K529 Noninfective gastroenteritis and colitis, unspecified: Secondary | ICD-10-CM | POA: Diagnosis not present

## 2021-08-29 DIAGNOSIS — K6289 Other specified diseases of anus and rectum: Secondary | ICD-10-CM | POA: Diagnosis not present

## 2021-08-29 DIAGNOSIS — K5939 Other megacolon: Secondary | ICD-10-CM | POA: Diagnosis not present

## 2021-08-29 DIAGNOSIS — K5150 Left sided colitis without complications: Secondary | ICD-10-CM | POA: Diagnosis not present

## 2021-08-29 LAB — CALPROTECTIN, FECAL: Calprotectin, Fecal: 24 ug/g (ref 0–120)

## 2021-08-29 MED ORDER — SODIUM CHLORIDE 0.9 % IV SOLN: 500.0000 mL | Freq: Once | INTRAVENOUS | Status: DC

## 2021-08-29 NOTE — Telephone Encounter (Signed)
-----   Message from Thornton Park, MD sent at 08/29/2021 10:32 AM EDT ----- ?Please arrange office follow-up with the next available app.  Thank you. ? ?

## 2021-08-29 NOTE — Telephone Encounter (Signed)
Per Dr. Tarri Glenn request, pt has been scheduled for f/u with Vicie Mutters, PA-C on 4/24 @ 11am. Appt reminder has been mailed. Appt will reflect on [facility] AVS upon discharge for pt future reference. ? ?

## 2021-08-29 NOTE — Progress Notes (Signed)
Indication for procedure: Diarrhea, abdominal pain ? ?Please see my consultation note from 08/20/2021.  There is been no change in history or physical exam since that time.  He remains an appropriate candidate for monitored anesthesia care in the endoscopy center. ?

## 2021-08-29 NOTE — Op Note (Signed)
[facility] ?Patient Name: Alexander Lynch ?Procedure Date: 08/29/2021 10:12 AM ?MRN: [id number] ?Endoscopist: Thornton Park MD, MD ?Age: 41 ?Referring MD:  ?Date of Birth: 1980/09/01 ?Gender: Male ?Account #: [id number] ?Procedure:                Colonoscopy ?Indications:              Chronic diarrhea ?                          Abdominal pain ?                          Acute ileitis on colonoscopy 2013 without classic  ?                          features of IBD on biopsies ?                          Incidental history: Raynaud's ?Medicines:                Monitored Anesthesia Care ?Procedure:                Pre-Anesthesia Assessment: ?                          - Prior to the procedure, a History and Physical  ?                          was performed, and patient medications and  ?                          allergies were reviewed. The patient's tolerance of  ?                          previous anesthesia was also reviewed. The risks  ?                          and benefits of the procedure and the sedation  ?                          options and risks were discussed with the patient.  ?                          All questions were answered, and informed consent  ?                          was obtained. Prior Anticoagulants: The patient has  ?                          taken no previous anticoagulant or antiplatelet  ?                          agents. ASA Grade Assessment: II - A patient with  ?                          mild systemic disease. After reviewing  the risks  ?                          and benefits, the patient was deemed in  ?                          satisfactory condition to undergo the procedure. ?                          After obtaining informed consent, the colonoscope  ?                          was passed under direct vision. Throughout the  ?                          procedure, the patient's blood pressure, pulse, and  ?                          oxygen saturations were monitored  continuously. The  ?                          CF [id number] #[id number] was introduced through the anus  ?                          and advanced to the 10 cm into the ileum. A second  ?                          forward view of the right colon was performed. The  ?                          colonoscopy was performed without difficulty. The  ?                          patient tolerated the procedure well. The quality  ?                          of the bowel preparation was good. The terminal  ?                          ileum, ileocecal valve, appendiceal orifice, and  ?                          rectum were photographed. ?Scope In: 10:18:09 AM ?Scope Out: 10:31:24 AM ?Scope Withdrawal Time: 0 hours 10 minutes 57 seconds  ?Total Procedure Duration: 0 hours 13 minutes 15 seconds  ?Findings:                 The perianal and digital rectal examinations were  ?                          normal. ?                          A 3 mm polyp was found in the transverse colon. The  ?  polyp was flat. The polyp was removed with a cold  ?                          snare. Resection and retrieval were complete.  ?                          Estimated blood loss was minimal. ?                          The colon (entire examined portion) appeared  ?                          normal. Biopsies were taken from the right colon,  ?                          transverse, left colon and rectum with a cold  ?                          forceps for histology. Estimated blood loss was  ?                          minimal. ?                          The terminal ileum appeared normal. Biopsies were  ?                          taken with a cold forceps for histology. Estimated  ?                          blood loss was minimal. ?Complications:            No immediate complications. ?Estimated Blood Loss:     Estimated blood loss was minimal. ?Impression:               - One 3 mm polyp in the transverse colon, removed  ?                           with a cold snare. Resected and retrieved. ?                          - The entire examined colon and distal terminal  ?                          ileum are normal. Biopsied. ?                          - The examination was otherwise normal on direct  ?                          and retroflexion views. ?Recommendation:           - Patient has a contact number available for  ?  emergencies. The signs and symptoms of potential  ?                          delayed complications were discussed with the  ?                          patient. Return to normal activities tomorrow.  ?                          Written discharge instructions were provided to the  ?                          patient. ?                          - Resume previous diet. ?                          - Continue present medications. ?                          - Await pathology results. ?                          - Repeat colonoscopy date to be determined after  ?                          pending pathology results are reviewed for  ?                          surveillance. ?                          - Emerging evidence supports eating a diet of  ?                          fruits, vegetables, grains, calcium, and yogurt  ?                          while reducing red meat and alcohol may reduce the  ?                          risk of colon cancer. ?                          - Thank you for allowing me to be involved in your  ?                          colon cancer prevention. ?Thornton Park MD, MD ?08/29/2021 10:39:35 AM ?This report has been signed electronically. ?

## 2021-08-29 NOTE — Patient Instructions (Signed)
Await pathology results   YOU HAD AN ENDOSCOPIC PROCEDURE TODAY AT THE [facility]:   Refer to the procedure report that was given to you for any specific questions about what was found during the examination.  If the procedure report does not answer your questions, please call your gastroenterologist to clarify.  If you requested that your care partner not be given the details of your procedure findings, then the procedure report has been included in a sealed envelope for you to review at your convenience later.  YOU SHOULD EXPECT: Some feelings of bloating in the abdomen. Passage of more gas than usual.  Walking can help get rid of the air that was put into your GI tract during the procedure and reduce the bloating. If you had a lower endoscopy (such as a colonoscopy or flexible sigmoidoscopy) you may notice spotting of blood in your stool or on the toilet paper. If you underwent a bowel prep for your procedure, you may not have a normal bowel movement for a few days.  Please Note:  You might notice some irritation and congestion in your nose or some drainage.  This is from the oxygen used during your procedure.  There is no need for concern and it should clear up in a day or so.  SYMPTOMS TO REPORT IMMEDIATELY:   Following lower endoscopy (colonoscopy or flexible sigmoidoscopy):  Excessive amounts of blood in the stool  Significant tenderness or worsening of abdominal pains  Swelling of the abdomen that is new, acute  Fever of 100F or higher  For urgent or emergent issues, a gastroenterologist can be reached at any hour by calling [phone]. Do not use MyChart messaging for urgent concerns.    DIET:  We do recommend a small meal at first, but then you may proceed to your regular diet.  Drink plenty of fluids but you should avoid alcoholic beverages for 24 hours.  ACTIVITY:  You should plan to take it easy for the rest of today and you should NOT DRIVE or use heavy  machinery until tomorrow (because of the sedation medicines used during the test).    FOLLOW UP: Our staff will call the number listed on your records 48-72 hours following your procedure to check on you and address any questions or concerns that you may have regarding the information given to you following your procedure. If we do not reach you, we will leave a message.  We will attempt to reach you two times.  During this call, we will ask if you have developed any symptoms of COVID 19. If you develop any symptoms (ie: fever, flu-like symptoms, shortness of breath, cough etc.) before then, please call [phone].  If you test positive for Covid 19 in the 2 weeks post procedure, please call and report this information to us.    If any biopsies were taken you will be contacted by phone or by letter within the next 1-3 weeks.  Please call us at [phone] if you have not heard about the biopsies in 3 weeks.    SIGNATURES/CONFIDENTIALITY: You and/or your care partner have signed paperwork which will be entered into your electronic medical record.  These signatures attest to the fact that that the information above on your After Visit Summary has been reviewed and is understood.  Full responsibility of the confidentiality of this discharge information lies with you and/or your care-partner. 

## 2021-08-29 NOTE — Progress Notes (Signed)
Pt's states no medical or surgical changes since previsit or office visit. 

## 2021-08-29 NOTE — Progress Notes (Signed)
Report given to PACU, vss 

## 2021-09-03 ENCOUNTER — Telehealth: Payer: Self-pay | Admitting: *Deleted

## 2021-09-03 ENCOUNTER — Telehealth: Payer: Self-pay

## 2021-09-03 NOTE — Telephone Encounter (Signed)
Attempted 2nd f/u phone call. No answer. Mailbox is full, unable to leave message.  ?

## 2021-09-03 NOTE — Telephone Encounter (Signed)
No answer, unable to leave a message, B.Chadric Kimberley RN. 

## 2021-09-13 NOTE — Progress Notes (Signed)
? ? ?09/16/2021 ?Alexander Lynch ?[id number] ?1980-05-31 ? ?Primary GI doctor: Dr. Tarri Glenn ? ?ASSESSMENT AND PLAN:  ? ?Irritable bowel syndrome with diarrhea ?-     rifaximin (XIFAXAN) 550 MG TABS tablet; Take 1 tablet (550 mg total) by mouth 3 (three) times daily for 14 days. ?negative fecal calprotectin, negative CRP, negative Giardia, thyroid normal, celiac negative ?Normal colonoscopy ?Willing to do trial of xifaxin, sent in for patient.  ?If cost prohibitive can do doxycyline.  ?Given FODMAP information and discussed diet.  ? ?History of adenomatous polyp of colon ?Comments: ?08/29/2021 colonoscopy with Dr. Tarri Glenn For chronic diarrhea and abdominal pain and acute ileitis on colonoscopy 2013 without classic features of IBD 3 mm adeno ? ? ?History of Present Illness:  ?41 y.o. male  with a past medical history of vitamin D deficiency, Raynaud's, B12 deficiency and others listed below, returns to clinic today for evaluation of chronic diarrhea. ? ?Last office visit with Dr. Tarri Glenn had negative fecal calprotectin, negative CRP, negative Giardia, thyroid normal, celiac negative, was set up for colonoscopy. ? ?08/29/2021 colonoscopy with Dr. Tarri Glenn For chronic diarrhea and abdominal pain and acute ileitis on colonoscopy 2013 without classic features of IBD 3 mm adenomatous polyp transverse colon entire colon appeared normal biopsies showed no ileitis or colitis. ?Recall 7 years. ? ?He states he has issues with milk, oats, iceberg lettuce and some cheeses.  ?He has more cramping and diarrhea from symptoms, only when he eats those foods. Does not have much AB bloating.  ?Due to patient's symptoms patient was given trial of Xifaxan for 14 days but patient declined at that time and would like to discuss this office visit. ?Patient states he did take ABX 1-2 years ago for his back and symptoms started after that.  ? ?Current Medications:  ? ? ? ? ? ?Current Outpatient Medications (Hematological):  ?  Cyanocobalamin  (VITAMIN B 12 PO), Take 2,500 Units by mouth daily. ? ?Current Outpatient Medications (Other):  ?  Cholecalciferol (VITAMIN D) 50 MCG (2000 UT) CAPS, Take 1 capsule by mouth daily. ?  dicyclomine (BENTYL) 20 MG tablet, Take 1 tablet (20 mg total) by mouth 4 (four) times daily -  before meals and at bedtime. (Patient taking differently: Take 20 mg by mouth as needed.) ?  rifaximin (XIFAXAN) 550 MG TABS tablet, Take 1 tablet (550 mg total) by mouth 3 (three) times daily for 14 days. ? ?Surgical History:  ?He  has a past surgical history that includes Cyst excision and Wisdom tooth extraction. ?Family History:  ?His family history includes Breast cancer in his maternal aunt and mother; Cancer in his maternal grandfather and maternal grandmother; Dementia in his maternal grandmother; Diabetes in his maternal grandfather and mother; Hearing loss in his father and mother; Hypertension in his mother; Obesity in his mother. ?Social History:  ? reports that he has never smoked. He has never used smokeless tobacco. He reports that he does not currently use alcohol. He reports that he does not use drugs. ? ?Current Medications, Allergies, Past Medical History, Past Surgical History, Family History and Social History were reviewed in [employer] record. ? ?Physical Exam: ?BP (!) 89/76   Pulse 98   Ht '6\' 1"'$  (1.854 m)   Wt 199 lb 9.6 oz (90.5 kg)   SpO2 98%   BMI 26.33 kg/m?  ?General:   Pleasant, well developed male in no acute distress ?Heart : Regular rate and rhythm; no murmurs ?Pulm: Clear anteriorly; no wheezing ?Abdomen:  Soft, Non-distended AB, Active bowel sounds. No tenderness . , No organomegaly appreciated. ?Extremities:  without  edema. ?Neurologic:  Alert and  oriented x4;  No focal deficits.  ?Psych:  Cooperative. Normal mood and affect. ? ? ?Vladimir Crofts, PA-C ?09/16/21 ?

## 2021-09-16 ENCOUNTER — Telehealth: Payer: Self-pay

## 2021-09-16 ENCOUNTER — Encounter: Payer: Self-pay | Admitting: Physician Assistant

## 2021-09-16 ENCOUNTER — Ambulatory Visit: Payer: 59 | Admitting: Physician Assistant

## 2021-09-16 VITALS — BP 89/76 | HR 98 | Ht 73.0 in | Wt 199.6 lb

## 2021-09-16 DIAGNOSIS — Z86010 Personal history of colonic polyps: Secondary | ICD-10-CM | POA: Diagnosis not present

## 2021-09-16 DIAGNOSIS — K580 Irritable bowel syndrome with diarrhea: Secondary | ICD-10-CM | POA: Diagnosis not present

## 2021-09-16 MED ORDER — RIFAXIMIN 550 MG PO TABS: 550.0000 mg | ORAL_TABLET | Freq: Three times a day (TID) | ORAL | 0 refills | Status: AC

## 2021-09-16 NOTE — Patient Instructions (Addendum)
? ?  Will do trial of xifaxin, if this does not help  ? ?Please try low FODMAP diet ?Try trial off milk/lactose products.  ?Add fiber like benefiber or citracel once a day ?Increase activity ?Can do trial of IBGard for AB pain- Take 1-2 capsules once a day for maintence or twice a day during a flare ?Please try to decrease stress. ?if any worsening symptoms like blood in stool, weight loss, please call the office or go to the ER.  ? ? ?Please go to the ER if you have any severe AB pain, unable to hold down food/water, blood in stool or vomit, chest pain, shortness of breath, or any worsening symptoms.  ?  ?Consider keeping a food diary- common causes of diarrhea are dairy, certain carbs... ?  ?FODMAP stands for fermentable oligo-, di-, mono-saccharides and polyols (1). ?These are the scientific terms used to classify groups of carbs that are notorious for triggering digestive symptoms like bloating, gas and stomach pain.  ? ?FODMAPs are found in a wide range of foods in varying amounts. Some foods contain just one type, while others contain several. ? ?The main dietary sources of the four groups of FODMAPs include:  ?Oligosaccharides: Wheat, rye, legumes and various fruits and vegetables, such as garlic and onions. ? ?Disaccharides: Milk, yogurt and soft cheese. Lactose is the main carb. ? ?Monosaccharides: Various fruit including figs and mangoes, and sweeteners such as honey and agave nectar. Fructose is the main carb. ? ?Polyols: Certain fruits and vegetables including blackberries and lychee, as well as some low-calorie sweeteners like those in sugar-free gum. ?  ?Keep a food diary. This will help you identify foods that cause symptoms. Write down: ?What you eat and when. ?What symptoms you have. ?When symptoms occur in relation to your meals. ?Avoid foods that cause symptoms. Talk with your dietitian about other ways to get the same nutrients that are in these foods. ?Eat your meals slowly, in a relaxed  setting. ?Aim to eat 5-6 small meals per day. Do not skip meals. ?Drink enough fluids to keep your urine clear or pale yellow. ?If dairy products cause your symptoms to flare up, try eating less of them. You might be able to handle yogurt better than other dairy products because it contains bacteria that help with digestion. ?  ? ? ? ?

## 2021-09-16 NOTE — Telephone Encounter (Signed)
Prior Authorization started on Xifaxan through Cover My Meds. ?

## 2021-09-17 NOTE — Progress Notes (Signed)
Reviewed and agree with management plans. ? ?Trenika Hudson L. Lil Lepage, MD, MPH  ?

## 2021-11-28 ENCOUNTER — Encounter: Payer: Self-pay | Admitting: Family Medicine

## 2021-11-28 ENCOUNTER — Ambulatory Visit (INDEPENDENT_AMBULATORY_CARE_PROVIDER_SITE_OTHER): Payer: 59 | Admitting: Family Medicine

## 2021-11-28 VITALS — BP 120/82 | HR 82 | Temp 98.0°F | Ht 73.0 in | Wt 196.6 lb

## 2021-11-28 DIAGNOSIS — K589 Irritable bowel syndrome without diarrhea: Secondary | ICD-10-CM

## 2021-11-28 DIAGNOSIS — R739 Hyperglycemia, unspecified: Secondary | ICD-10-CM

## 2021-11-28 DIAGNOSIS — Z1159 Encounter for screening for other viral diseases: Secondary | ICD-10-CM | POA: Diagnosis not present

## 2021-11-28 DIAGNOSIS — E538 Deficiency of other specified B group vitamins: Secondary | ICD-10-CM

## 2021-11-28 DIAGNOSIS — E559 Vitamin D deficiency, unspecified: Secondary | ICD-10-CM | POA: Diagnosis not present

## 2021-11-28 DIAGNOSIS — E785 Hyperlipidemia, unspecified: Secondary | ICD-10-CM | POA: Diagnosis not present

## 2021-11-28 DIAGNOSIS — Z0001 Encounter for general adult medical examination with abnormal findings: Secondary | ICD-10-CM | POA: Diagnosis not present

## 2021-11-28 DIAGNOSIS — M545 Low back pain, unspecified: Secondary | ICD-10-CM

## 2021-11-28 LAB — COMPREHENSIVE METABOLIC PANEL
ALT: 21 U/L (ref 0–53)
AST: 14 U/L (ref 0–37)
Albumin: 4.7 g/dL (ref 3.5–5.2)
Alkaline Phosphatase: 54 U/L (ref 39–117)
BUN: 17 mg/dL (ref 6–23)
CO2: 31 mEq/L (ref 19–32)
Calcium: 9.6 mg/dL (ref 8.4–10.5)
Chloride: 100 mEq/L (ref 96–112)
Creatinine, Ser: 0.85 mg/dL (ref 0.40–1.50)
GFR: 106.51 mL/min (ref >60.00)
Glucose, Bld: 88 mg/dL (ref 70–99)
Potassium: 4.4 mEq/L (ref 3.5–5.1)
Sodium: 139 mEq/L (ref 135–145)
Total Bilirubin: 0.6 mg/dL (ref 0.2–1.2)
Total Protein: 7.5 g/dL (ref 6.0–8.3)

## 2021-11-28 LAB — CBC
HCT: 43.1 % (ref 39.0–52.0)
Hemoglobin: 14.5 g/dL (ref 13.0–17.0)
MCHC: 33.6 g/dL (ref 30.0–36.0)
MCV: 90.8 fl (ref 78.0–100.0)
Platelets: 150 10*3/uL (ref 150.0–400.0)
RBC: 4.75 Mil/uL (ref 4.22–5.81)
RDW: 12.6 % (ref 11.5–15.5)
WBC: 4.2 10*3/uL (ref 4.0–10.5)

## 2021-11-28 LAB — VITAMIN D 25 HYDROXY (VIT D DEFICIENCY, FRACTURES): VITD: 39.13 ng/mL (ref 30.00–100.00)

## 2021-11-28 LAB — LIPID PANEL
Cholesterol: 186 mg/dL (ref 0–200)
HDL: 37.1 mg/dL — ABNORMAL LOW (ref >39.00)
LDL Cholesterol: 110 mg/dL — ABNORMAL HIGH (ref 0–99)
NonHDL: 149
Total CHOL/HDL Ratio: 5
Triglycerides: 197 mg/dL — ABNORMAL HIGH (ref 0.0–149.0)
VLDL: 39.4 mg/dL (ref 0.0–40.0)

## 2021-11-28 LAB — TSH: TSH: 1.02 u[IU]/mL (ref 0.35–5.50)

## 2021-11-28 LAB — VITAMIN B12: Vitamin B-12: 740 pg/mL (ref 211–911)

## 2021-11-28 LAB — HEMOGLOBIN A1C: Hgb A1c MFr Bld: 5.3 % (ref 4.6–6.5)

## 2021-11-28 NOTE — Assessment & Plan Note (Signed)
Follows with gastroenterology.  Symptoms are stable with dietary modifications.

## 2021-11-28 NOTE — Assessment & Plan Note (Signed)
No current symptoms.  Occasionally has flareups that are treated with over-the-counter medications.  We will let me know if he has flareup in future does not respond to this.  We discussed conservative measures.

## 2021-11-28 NOTE — Progress Notes (Signed)
Chief Complaint:  Alexander Lynch is a 41 y.o. male who presents today for his annual comprehensive physical exam.  He was last seen in this office over 3 years ago by different provider and is transferring to [place] for primary care.  Assessment/Plan:  Chronic Problems Addressed Today: Low back pain No current symptoms.  Occasionally has flareups that are treated with over-the-counter medications.  We will let me know if he has flareup in future does not respond to this.  We discussed conservative measures.  B12 deficiency On B12 supplementation.  Check B12 level today.  IBS (irritable bowel syndrome) Follows with gastroenterology.  Symptoms are stable with dietary modifications.  Vitamin D deficiency Check vitamin D.  He is on 2000 IU daily.   Dyslipidemia Check lipids.  Discussed lifestyle modifications.  Preventative Healthcare: Check labs.  Due for colon cancer screening age 31.  Patient Counseling(The following topics were reviewed and/or handout was given):  -Nutrition: Stressed importance of moderation in sodium/caffeine intake, saturated fat and cholesterol, caloric balance, sufficient intake of fresh fruits, vegetables, and fiber.  -Stressed the importance of regular exercise.   -Substance Abuse: Discussed cessation/primary prevention of tobacco, alcohol, or other drug use; driving or other dangerous activities under the influence; availability of treatment for abuse.   -Injury prevention: Discussed safety belts, safety helmets, smoke detector, smoking near bedding or upholstery.   -Sexuality: Discussed sexually transmitted diseases, partner selection, use of condoms, avoidance of unintended pregnancy and contraceptive alternatives.   -Dental health: Discussed importance of regular tooth brushing, flossing, and dental visits.  -Health maintenance and immunizations reviewed. Please refer to Health maintenance section.  Return to care in 1 year for next preventative visit.      Subjective:  HPI:  He has no acute complaints today.   Lifestyle Diet: Balanced. Plenty of fruits and vegetables.  Exercise: None specific.      11/28/2021    9:41 AM  Depression screen PHQ 2/9  Decreased Interest 0  Down, Depressed, Hopeless 0  PHQ - 2 Score 0    Health Maintenance Due  Topic Date Due   Hepatitis C Screening  Never done     ROS: Per HPI, otherwise a complete review of systems was negative.   PMH:  The following were reviewed and entered/updated in epic: Past Medical History:  Diagnosis Date   History of chicken pox    Hyperbilirubinemia at birth    Raynaud's disease    Vitamin D deficiency    Patient Active Problem List   Diagnosis Date Noted   IBS (irritable bowel syndrome) 11/28/2021   B12 deficiency 11/28/2021   Low back pain 11/28/2021   Dyslipidemia 11/28/2021   History of adenomatous polyp of colon 09/16/2021   Jaundice 08/20/2021   Raynauds phenomenon 09/01/2018   ANA positive 09/01/2018   Vitamin D deficiency 09/01/2018   Past Surgical History:  Procedure Laterality Date   CYST EXCISION     Neck   WISDOM TOOTH EXTRACTION      Family History  Problem Relation Age of Onset   Diabetes Mother    Hypertension Mother    Hearing loss Mother    Obesity Mother    Breast cancer Mother    Hearing loss Father    Breast cancer Maternal Aunt    Cancer Maternal Grandmother        type unknown   Dementia Maternal Grandmother    Cancer Maternal Grandfather        type unknown  Diabetes Maternal Grandfather    Colon cancer Neg Hx    Esophageal cancer Neg Hx    Stomach cancer Neg Hx    Colon polyps Neg Hx     Medications- reviewed and updated Current Outpatient Medications  Medication Sig Dispense Refill   Cholecalciferol (VITAMIN D) 50 MCG (2000 UT) CAPS Take 1 capsule by mouth daily.     Cyanocobalamin (VITAMIN B 12 PO) Take 2,500 Units by mouth daily.     No current facility-administered medications for this visit.     Allergies-reviewed and updated No Known Allergies  Social History   Socioeconomic History   Marital status: Married    Spouse name: Anderson Malta   Number of children: 1   Years of education: Not on file   Highest education level: Not on file  Occupational History   Not on file  Tobacco Use   Smoking status: Never   Smokeless tobacco: Never  Vaping Use   Vaping Use: Never used  Substance and Sexual Activity   Alcohol use: Not Currently    Comment: rarely   Drug use: Never   Sexual activity: Yes    Birth control/protection: None  Other Topics Concern   Not on file  Social History Narrative   Not on file   Social Determinants of Health   Financial Resource Strain: Not on file  Food Insecurity: Not on file  Transportation Needs: Not on file  Physical Activity: Not on file  Stress: Not on file  Social Connections: Not on file        Objective:  Physical Exam: BP 120/82   Pulse 82   Temp 98 F (36.7 C) (Temporal)   Ht '6\' 1"'$  (1.854 m)   Wt 196 lb 9.6 oz (89.2 kg)   SpO2 100%   BMI 25.94 kg/m   Body mass index is 25.94 kg/m. Wt Readings from Last 3 Encounters:  11/28/21 196 lb 9.6 oz (89.2 kg)  09/16/21 199 lb 9.6 oz (90.5 kg)  08/29/21 201 lb (91.2 kg)   Gen: NAD, resting comfortably HEENT: TMs normal bilaterally. OP clear. No thyromegaly noted.  CV: RRR with no murmurs appreciated Pulm: NWOB, CTAB with no crackles, wheezes, or rhonchi GI: Normal bowel sounds present. Soft, Nontender, Nondistended. MSK: no edema, cyanosis, or clubbing noted Skin: warm, dry Neuro: CN2-12 grossly intact. Strength 5/5 in upper and lower extremities. Reflexes symmetric and intact bilaterally.  Psych: Normal affect and thought content     Zehava Turski M. Jerline Pain, MD 11/28/2021 10:05 AM

## 2021-11-28 NOTE — Assessment & Plan Note (Signed)
Check lipids. Discussed lifestyle modifications.  

## 2021-11-28 NOTE — Patient Instructions (Signed)
It was very nice to see you today!  We we will check blood work today.  Please continue to work on diet and exercise.  We will see you back in year for your next physical.  Come back sooner if needed.  Take care, Dr Jerline Pain  PLEASE NOTE:  If you had any lab tests please let us know if you have not heard back within a few days. You may see your results on mychart before we have a chance to review them but we will give you a call once they are reviewed by [place]. If we ordered any referrals today, please let us know if you have not heard from their office within the next week.   Please try these tips to maintain a healthy lifestyle:  Eat at least 3 REAL meals and 1-2 snacks per day.  Aim for no more than 5 hours between eating.  If you eat breakfast, please do so within one hour of getting up.   Each meal should contain half fruits/vegetables, one quarter protein, and one quarter carbs (no bigger than a computer mouse)  Cut down on sweet beverages. This includes juice, soda, and sweet tea.   Drink at least 1 glass of water with each meal and aim for at least 8 glasses per day  Exercise at least 150 minutes every week.    Preventive Care 3-29 Years Old, Male Preventive care refers to lifestyle choices and visits with your health care provider that can promote health and wellness. Preventive care visits are also called wellness exams. What can I expect for my preventive care visit? Counseling During your preventive care visit, your health care provider may ask about your: Medical history, including: Past medical problems. Family medical history. Current health, including: Emotional well-being. Home life and relationship well-being. Sexual activity. Lifestyle, including: Alcohol, nicotine or tobacco, and drug use. Access to firearms. Diet, exercise, and sleep habits. Safety issues such as seatbelt and bike helmet use. Sunscreen use. Work and work Statistician. Physical exam Your  health care provider will check your: Height and weight. These may be used to calculate your BMI (body mass index). BMI is a measurement that tells if you are at a healthy weight. Waist circumference. This measures the distance around your waistline. This measurement also tells if you are at a healthy weight and may help predict your risk of certain diseases, such as type 2 diabetes and high blood pressure. Heart rate and blood pressure. Body temperature. Skin for abnormal spots. What immunizations do I need?  Vaccines are usually given at various ages, according to a schedule. Your health care provider will recommend vaccines for you based on your age, medical history, and lifestyle or other factors, such as travel or where you work. What tests do I need? Screening Your health care provider may recommend screening tests for certain conditions. This may include: Lipid and cholesterol levels. Diabetes screening. This is done by checking your blood sugar (glucose) after you have not eaten for a while (fasting). Hepatitis B test. Hepatitis C test. HIV (human immunodeficiency virus) test. STI (sexually transmitted infection) testing, if you are at risk. Lung cancer screening. Prostate cancer screening. Colorectal cancer screening. Talk with your health care provider about your test results, treatment options, and if necessary, the need for more tests. Follow these instructions at home: Eating and drinking  Eat a diet that includes fresh fruits and vegetables, whole grains, lean protein, and low-fat dairy products. Take vitamin and mineral supplements as  recommended by your health care provider. Do not drink alcohol if your health care provider tells you not to drink. If you drink alcohol: Limit how much you have to 0-2 drinks a day. Know how much alcohol is in your drink. In the [place] one drink equals one 12 oz bottle of beer (355 mL), one 5 oz glass of wine (148 mL), or one 1 oz glass of  hard liquor (44 mL). Lifestyle Brush your teeth every morning and night with fluoride toothpaste. Floss one time each day. Exercise for at least 30 minutes 5 or more days each week. Do not use any products that contain nicotine or tobacco. These products include cigarettes, chewing tobacco, and vaping devices, such as e-cigarettes. If you need help quitting, ask your health care provider. Do not use drugs. If you are sexually active, practice safe sex. Use a condom or other form of protection to prevent STIs. Take aspirin only as told by your health care provider. Make sure that you understand how much to take and what form to take. Work with your health care provider to find out whether it is safe and beneficial for you to take aspirin daily. Find healthy ways to manage stress, such as: Meditation, yoga, or listening to music. Journaling. Talking to a trusted person. Spending time with friends and family. Minimize exposure to UV radiation to reduce your risk of skin cancer. Safety Always wear your seat belt while driving or riding in a vehicle. Do not drive: If you have been drinking alcohol. Do not ride with someone who has been drinking. When you are tired or distracted. While texting. If you have been using any mind-altering substances or drugs. Wear a helmet and other protective equipment during sports activities. If you have firearms in your house, make sure you follow all gun safety procedures. What's next? Go to your health care provider once a year for an annual wellness visit. Ask your health care provider how often you should have your eyes and teeth checked. Stay up to date on all vaccines. This information is not intended to replace advice given to you by your health care provider. Make sure you discuss any questions you have with your health care provider. Document Revised: 11/07/2020 Document Reviewed: 11/07/2020 Elsevier Patient Education  [employer].

## 2021-11-28 NOTE — Assessment & Plan Note (Signed)
On B12 supplementation.  Check B12 level today.

## 2021-11-28 NOTE — Assessment & Plan Note (Signed)
Check vitamin D.  He is on 2000 IU daily.

## 2021-11-29 LAB — HEPATITIS C ANTIBODY: Hepatitis C Ab: NONREACTIVE

## 2021-12-02 NOTE — Progress Notes (Signed)
Please inform patient of the following:  His "bad" cholesterol is a little bit elevated and his cholesterol is a little low.  All his other levels are normal.  Do not need to start medications at this point.  He should continue to work on diet and exercise and we can recheck in a year.

## 2022-02-17 ENCOUNTER — Encounter: Payer: Self-pay | Admitting: *Deleted

## 2022-05-08 ENCOUNTER — Encounter: Payer: Self-pay | Admitting: *Deleted

## 2022-11-07 ENCOUNTER — Ambulatory Visit: Payer: 59 | Admitting: Family Medicine

## 2022-11-07 VITALS — BP 122/86 | HR 76 | Temp 98.4°F | Resp 18 | Ht 73.0 in | Wt 197.2 lb

## 2022-11-07 DIAGNOSIS — R21 Rash and other nonspecific skin eruption: Secondary | ICD-10-CM | POA: Diagnosis not present

## 2022-11-07 MED ORDER — CLINDAMYCIN PHOSPHATE 1 % EX SWAB: 1.0000 | Freq: Two times a day (BID) | CUTANEOUS | 0 refills | Status: AC

## 2022-11-07 NOTE — Progress Notes (Unsigned)
   Subjective:     Patient ID: Alexander Lynch, male    DOB: June 08, 1980, 42 y.o.   MRN: [id number]  Chief Complaint  Patient presents with   Rash    Rash on inner thighs in groin area x 1 month, brown in color, no pain or itch Used OTC hydrocortisone cream and triple antibiotic ointment    HPI Rash inner thight/groin for 1 month.  No pain/itch-irrit.  No DM.   No odor or D/C.  Using OTC (available over the counter without a prescription) HC and abx cream.  No change  There are no preventive care reminders to display for this patient.  Past Medical History:  Diagnosis Date   History of chicken pox    Hyperbilirubinemia at birth    Raynaud's disease    Vitamin D deficiency     Past Surgical History:  Procedure Laterality Date   CYST EXCISION     Neck   WISDOM TOOTH EXTRACTION       Current Outpatient Medications:    Cholecalciferol (VITAMIN D) 50 MCG (2000 UT) CAPS, Take 1 capsule by mouth daily., Disp: , Rfl:    Cyanocobalamin (VITAMIN B 12 PO), Take 2,500 Units by mouth daily., Disp: , Rfl:   No Known Allergies ROS neg/noncontributory except as noted HPI/below      Objective:     BP 122/86   Pulse 76   Temp 98.4 F (36.9 C) (Temporal)   Resp 18   Ht 6\' 1"  (1.854 m)   Wt 197 lb 4 oz (89.5 kg)   SpO2 99%   BMI 26.02 kg/m  Wt Readings from Last 3 Encounters:  11/07/22 197 lb 4 oz (89.5 kg)  11/28/21 196 lb 9.6 oz (89.2 kg)  09/16/21 199 lb 9.6 oz (90.5 kg)    Physical Exam   Gen: WDWN NAD HEENT: NCAT, conjunctiva not injected, sclera nonicteric NECK:  supple, no thyromegaly, no nodes, no carotid bruits CARDIAC: RRR, S1S2+, no murmur. DP 2+B LUNGS: CTAB. No wheezes ABDOMEN:  BS+, soft, NTND, No HSM, no masses EXT:  no edema MSK: no gross abnormalities.  NEURO: A&O x3.  CN II-XII intact.  PSYCH: normal mood. Good eye contact Reviewed labs erythasma    Assessment & Plan:  There are no diagnoses linked to this encounter.  No follow-ups on  file.  Angelena Sole, MD

## 2022-11-07 NOTE — Patient Instructions (Signed)
If not improving on clinda, then let us know and will treat for jock itch

## 2023-05-22 DIAGNOSIS — K589 Irritable bowel syndrome without diarrhea: Secondary | ICD-10-CM | POA: Diagnosis not present

## 2023-05-22 DIAGNOSIS — E785 Hyperlipidemia, unspecified: Secondary | ICD-10-CM | POA: Diagnosis not present

## 2023-05-22 DIAGNOSIS — Z809 Family history of malignant neoplasm, unspecified: Secondary | ICD-10-CM | POA: Diagnosis not present
